# Patient Record
Sex: Female | Born: 1970 | Race: White | Hispanic: No | Smoking: Current every day smoker
Health system: Southern US, Community
[De-identification: ages and names within clinical notes are randomized; demographics above are authoritative.]

## PROBLEM LIST (undated history)

## (undated) DIAGNOSIS — I10 Essential (primary) hypertension: Secondary | ICD-10-CM

## (undated) DIAGNOSIS — F419 Anxiety disorder, unspecified: Secondary | ICD-10-CM

## (undated) DIAGNOSIS — D72829 Elevated white blood cell count, unspecified: Principal | ICD-10-CM

## (undated) HISTORY — DX: Elevated white blood cell count, unspecified: D72.829

## (undated) HISTORY — PX: CHOLECYSTECTOMY: SHX55

## (undated) HISTORY — PX: ABDOMINAL SURGERY: SHX537

## (undated) HISTORY — PX: ABDOMINAL HYSTERECTOMY: SHX81

---

## 2016-03-07 ENCOUNTER — Emergency Department (HOSPITAL_COMMUNITY): Payer: Self-pay

## 2016-03-07 ENCOUNTER — Encounter (HOSPITAL_COMMUNITY): Payer: Self-pay | Admitting: Emergency Medicine

## 2016-03-07 ENCOUNTER — Emergency Department (HOSPITAL_COMMUNITY)
Admission: EM | Admit: 2016-03-07 | Discharge: 2016-03-07 | Disposition: A | Discharge status: Home / Self Care | Payer: Self-pay | Attending: Emergency Medicine | Admitting: Emergency Medicine

## 2016-03-07 DIAGNOSIS — R0789 Other chest pain: Secondary | ICD-10-CM | POA: Insufficient documentation

## 2016-03-07 DIAGNOSIS — I10 Essential (primary) hypertension: Secondary | ICD-10-CM | POA: Insufficient documentation

## 2016-03-07 DIAGNOSIS — Z79899 Other long term (current) drug therapy: Secondary | ICD-10-CM | POA: Insufficient documentation

## 2016-03-07 DIAGNOSIS — F17210 Nicotine dependence, cigarettes, uncomplicated: Secondary | ICD-10-CM | POA: Insufficient documentation

## 2016-03-07 DIAGNOSIS — M546 Pain in thoracic spine: Secondary | ICD-10-CM | POA: Insufficient documentation

## 2016-03-07 HISTORY — DX: Anxiety disorder, unspecified: F41.9

## 2016-03-07 HISTORY — DX: Essential (primary) hypertension: I10

## 2016-03-07 LAB — CBC WITH DIFFERENTIAL/PLATELET
BASOS PCT: 0 %
Basophils Absolute: 0 10*3/uL (ref 0.0–0.1)
EOS ABS: 0.1 10*3/uL (ref 0.0–0.7)
EOS PCT: 1 %
HCT: 45.8 % (ref 36.0–46.0)
Hemoglobin: 14.9 g/dL (ref 12.0–15.0)
LYMPHS ABS: 6.5 10*3/uL — AB (ref 0.7–4.0)
Lymphocytes Relative: 38 %
MCH: 29.3 pg (ref 26.0–34.0)
MCHC: 32.5 g/dL (ref 30.0–36.0)
MCV: 90.2 fL (ref 78.0–100.0)
MONO ABS: 0.7 10*3/uL (ref 0.1–1.0)
MONOS PCT: 4 %
Neutro Abs: 9.6 10*3/uL — ABNORMAL HIGH (ref 1.7–7.7)
Neutrophils Relative %: 57 %
PLATELETS: 274 10*3/uL (ref 150–400)
RBC: 5.08 MIL/uL (ref 3.87–5.11)
RDW: 13.6 % (ref 11.5–15.5)
WBC: 16.9 10*3/uL — ABNORMAL HIGH (ref 4.0–10.5)

## 2016-03-07 LAB — COMPREHENSIVE METABOLIC PANEL
ALBUMIN: 4.3 g/dL (ref 3.5–5.0)
ALK PHOS: 92 U/L (ref 38–126)
ALT: 17 U/L (ref 14–54)
ANION GAP: 10 (ref 5–15)
AST: 19 U/L (ref 15–41)
BILIRUBIN TOTAL: 0.6 mg/dL (ref 0.3–1.2)
BUN: 13 mg/dL (ref 6–20)
CALCIUM: 9.3 mg/dL (ref 8.9–10.3)
CO2: 29 mmol/L (ref 22–32)
CREATININE: 0.78 mg/dL (ref 0.44–1.00)
Chloride: 101 mmol/L (ref 101–111)
GFR calc Af Amer: >60 mL/min (ref >60)
GFR calc non Af Amer: >60 mL/min (ref >60)
GLUCOSE: 105 mg/dL — AB (ref 65–99)
Potassium: 4.4 mmol/L (ref 3.5–5.1)
Sodium: 140 mmol/L (ref 135–145)
TOTAL PROTEIN: 7.3 g/dL (ref 6.5–8.1)

## 2016-03-07 LAB — D-DIMER, QUANTITATIVE (NOT AT ARMC): D DIMER QUANT: 0.46 ug{FEU}/mL (ref 0.00–0.50)

## 2016-03-07 LAB — TROPONIN I: Troponin I: <0.03 ng/mL (ref <0.031)

## 2016-03-07 MED ORDER — OXYCODONE-ACETAMINOPHEN 5-325 MG PO TABS
1.0000 | ORAL_TABLET | Freq: Once | ORAL | Status: AC
  Administered 2016-03-07: 1 via ORAL
  Filled 2016-03-07: qty 1

## 2016-03-07 MED ORDER — METHOCARBAMOL 500 MG PO TABS: 1000.0000 mg | ORAL_TABLET | Freq: Four times a day (QID) | ORAL | Status: AC | PRN

## 2016-03-07 MED ORDER — HYDROCODONE-ACETAMINOPHEN 5-325 MG PO TABS: ORAL_TABLET | ORAL | Status: AC

## 2016-03-07 NOTE — ED Provider Notes (Signed)
CSN: [id number]     Arrival date & time 03/07/16  0932 History   First MD Initiated Contact with Patient 03/07/16 1022     Chief Complaint  Patient presents with  . Chest Pain      HPI  Pt was seen at 1050. Per pt, c/o gradual onset and persistence of constant left sided chest wall "pain" that began yesterday. Has been associated with left sided mid-back "pain." Describes the pain as "tightness" and "aching," worse with palpation of the areas. Has been associated with SOB. States she saw her PMD 2 days ago "for the first time in a year," and was started on lisinopril, zoloft and ativan. Denies palpitations, no cough, no abd pain, no N/V/D, no injury, no focal motor weakness, no fevers, no rash.    Past Medical History  Diagnosis Date  . Hypertension   . Anxiety    Past Surgical History  Procedure Laterality Date  . Cholecystectomy    . Abdominal hysterectomy    . Abdominal surgery      exploratory  . Cesarean section      Social History  Substance Use Topics  . Smoking status: Current Every Day Smoker -- 0.50 packs/day    Types: Cigarettes  . Smokeless tobacco: None  . Alcohol Use: No     Comment: 07/2014 quit date   OB History    Gravida Para Term Preterm AB TAB SAB Ectopic Multiple Living   1         1     Review of Systems ROS: Statement: All systems negative except as marked or noted in the HPI; Constitutional: Negative for fever and chills. ; ; Eyes: Negative for eye pain, redness and discharge. ; ; ENMT: Negative for ear pain, hoarseness, nasal congestion, sinus pressure and sore throat. ; ; Cardiovascular: Negative for palpitations, diaphoresis, dyspnea and peripheral edema. ; ; Respiratory: Negative for cough, wheezing and stridor. ; ; Gastrointestinal: Negative for nausea, vomiting, diarrhea, abdominal pain, blood in stool, hematemesis, jaundice and rectal bleeding. . ; ; Genitourinary: Negative for dysuria, flank pain and hematuria. ; ; Musculoskeletal: +chest wall  pain, back pain. Negative for neck pain. Negative for swelling and trauma.; ; Skin: Negative for pruritus, rash, abrasions, blisters, bruising and skin lesion.; ; Neuro: Negative for headache, lightheadedness and neck stiffness. Negative for weakness, altered level of consciousness , altered mental status, extremity weakness, paresthesias, involuntary movement, seizure and syncope.      Allergies  Review of patient's allergies indicates no known allergies.  Home Medications   Prior to Admission medications   Medication Sig Start Date End Date Taking? Authorizing Provider  acidophilus (RISAQUAD) CAPS capsule Take 1 capsule by mouth daily.   Yes Historical Provider, MD  lisinopril-hydrochlorothiazide (PRINZIDE,ZESTORETIC) 20-12.5 MG tablet Take 1 tablet by mouth daily.   Yes Historical Provider, MD  LORazepam (ATIVAN) 0.5 MG tablet Take 0.5 mg by mouth 2 (two) times daily.   Yes Historical Provider, MD  Multiple Vitamins-Minerals (ALIVE ONCE DAILY WOMENS PO) Take 1 tablet by mouth daily.   Yes Historical Provider, MD  sertraline (ZOLOFT) 50 MG tablet Take 50 mg by mouth daily.   Yes Historical Provider, MD   BP 129/75 mmHg  Pulse 80  Temp(Src) 98.3 F (36.8 C) (Oral)  Resp 12  Ht 5' 3.5" (1.613 m)  Wt 204 lb (92.534 kg)  BMI 35.57 kg/m2  SpO2 94% Physical Exam  1055: Physical examination:  Nursing notes reviewed; Vital signs and O2 SAT reviewed;  Constitutional: Well developed, Well nourished, Well hydrated, Anxious; Head:  Normocephalic, atraumatic; Eyes: EOMI, PERRL, No scleral icterus; ENMT: TM's clear bilat. +edemetous nasal turbinates bilat with clear rhinorrhea. Mouth and pharynx without lesions. No tonsillar exudates. No intra-oral edema. No submandibular or sublingual edema. No hoarse voice, no drooling, no stridor. No pain with manipulation of larynx. No trismus. Mouth and pharynx normal, Mucous membranes moist; Neck: Supple, Full range of motion, No lymphadenopathy;  Cardiovascular: Regular rate and rhythm, No murmur, rub, or gallop; Respiratory: Breath sounds clear & equal bilaterally, No rales, rhonchi, wheezes.  Speaking full sentences with ease, Normal respiratory effort/excursion; Chest: +left parasternal and anterior chest wall areas tender to palp. No deformity, no soft tissue crepitus, no rash. Movement normal; Abdomen: Soft, Nontender, Nondistended, Normal bowel sounds; Genitourinary: No CVA tenderness; Spine:  No midline CS, TS, LS tenderness. +TTP left thoracic paraspinal muscles. No rash.;; Extremities: Pulses normal, No tenderness, No edema, No calf edema or asymmetry.; Neuro: AA&Ox3, Major CN grossly intact.  Speech clear. No gross focal motor or sensory deficits in extremities.; Skin: Color normal, Warm, Dry.; Psych:  Anxious, crying.     ED Course  Procedures (including critical care time) Labs Review   Imaging Review  I have personally reviewed and evaluated these images and lab results as part of my medical decision-making.   EKG Interpretation   Date/Time:  Friday March 07 2016 09:43:30 EDT Ventricular Rate:  81 PR Interval:  162 QRS Duration: 95 QT Interval:  384 QTC Calculation: 446 R Axis:   -75 Text Interpretation:  Sinus rhythm Left anterior fascicular block Low  voltage, precordial leads No old tracing to compare Confirmed by [facility]   MD, Nicholos Johns [phone]) on 03/07/2016 11:08:07 AM      MDM  MDM Reviewed: previous chart, nursing note and vitals Reviewed previous: labs and ECG Interpretation: labs, ECG and x-ray     Results for orders placed or performed during the hospital encounter of 03/07/16  Comprehensive metabolic panel  Result Value Ref Range   Sodium 140 135 - 145 mmol/L   Potassium 4.4 3.5 - 5.1 mmol/L   Chloride 101 101 - 111 mmol/L   CO2 29 22 - 32 mmol/L   Glucose, Bld 105 (H) 65 - 99 mg/dL   BUN 13 6 - 20 mg/dL   Creatinine, Ser 6.04 0.44 - 1.00 mg/dL   Calcium 9.3 8.9 - 54.0 mg/dL   Total Protein  7.3 6.5 - 8.1 g/dL   Albumin 4.3 3.5 - 5.0 g/dL   AST 19 15 - 41 U/L   ALT 17 14 - 54 U/L   Alkaline Phosphatase 92 38 - 126 U/L   Total Bilirubin 0.6 0.3 - 1.2 mg/dL   GFR calc non Af Amer >60 >60 mL/min   GFR calc Af Amer >60 >60 mL/min   Anion gap 10 5 - 15  Troponin I  Result Value Ref Range   Troponin I <0.03 <0.031 ng/mL  CBC with Differential  Result Value Ref Range   WBC 16.9 (H) 4.0 - 10.5 K/uL   RBC 5.08 3.87 - 5.11 MIL/uL   Hemoglobin 14.9 12.0 - 15.0 g/dL   HCT 98.1 19.1 - 47.8 %   MCV 90.2 78.0 - 100.0 fL   MCH 29.3 26.0 - 34.0 pg   MCHC 32.5 30.0 - 36.0 g/dL   RDW 29.5 62.1 - 30.8 %   Platelets 274 150 - 400 K/uL   Neutrophils Relative % 57 %   Neutro Abs  9.6 (H) 1.7 - 7.7 K/uL   Lymphocytes Relative 38 %   Lymphs Abs 6.5 (H) 0.7 - 4.0 K/uL   Monocytes Relative 4 %   Monocytes Absolute 0.7 0.1 - 1.0 K/uL   Eosinophils Relative 1 %   Eosinophils Absolute 0.1 0.0 - 0.7 K/uL   Basophils Relative 0 %   Basophils Absolute 0.0 0.0 - 0.1 K/uL   WBC Morphology ABSOLUTE LYMPHOCYTOSIS   D-dimer, quantitative  Result Value Ref Range   D-Dimer, Quant 0.46 0.00 - 0.50 ug/mL-FEU   Dg Chest 2 View 03/07/2016  CLINICAL DATA:  Chest pain. EXAM: CHEST  2 VIEW COMPARISON:  None. FINDINGS: The heart size and mediastinal contours are within normal limits. Both lungs are clear. No pneumothorax or pleural effusion is noted. Mild dextroscoliosis of thoracic spine is noted. IMPRESSION: No active cardiopulmonary disease. Electronically Signed   By: Lupita Raider, M.D.   On: 03/07/2016 12:32    1310:  WBC non-specifically elevated. Pt denies sore throat, dysuria. Remains afebrile. CXR without infiltrate. Doubt PE as cause for symptoms with normal d-dimer and low risk [place].  Doubt ACS as cause for symptoms with normal troponin and unchanged EKG from previous after 2 days of constant symptoms.  No clear indication for admission at this time.  Pt very tearful, crying, stating she is  "tired" and her "whole body hurts wherever you touch it," "no one cares or is helping" her.  Endorses admission to [facility] last year for "low WBC count" and "a raging infection from here to here" (points to left side top of head to left chest/armpit areas). Care Everywhere accessed: pt presented to [facility] ED for sinus pressure, dx localized sinus infection, was noted to have new neutropenia and transferred to [facility], pt had extensive workup by Oncology dept which was negative and she recovered without intervention other than abx for "sinus infection."  I went back and had extensive d/w pt regarding her medical records from [facility], which ultimately were reassuring. Pt continues tearful, stating she "doesn't know what to do next." Extensive discussion followed. Pt will f/u with Heme/Onc (whether [facility] or [facility]) and PMD. Pt has finally calmed, states she is ready to go home now. Dx and testing d/w pt and family.  Questions answered.  Verb understanding, agreeable to d/c home with outpt f/u.     Samuel Jester, DO 03/10/16 [phone]

## 2016-03-07 NOTE — Discharge Instructions (Signed)
Take the prescriptions as directed.  Apply moist heat or ice to the area(s) of discomfort, for 15 minutes at a time, several times per day for the next few days.  Do not fall asleep on a heating or ice pack.  Call your regular medical doctor and your Hematologist/Oncologist today to schedule a follow up appointment within the next week.  Return to the Emergency Department immediately if worsening.

## 2016-03-07 NOTE — ED Notes (Signed)
Assumed care of patient from Meghan, RN.  

## 2016-03-07 NOTE — ED Notes (Signed)
PT states has recently started on ativan, zoloft and lisinopril. PT states while walking dog yesterday she became SOB and had chest tightness with back pain as well. PT denies any pain at this time.

## 2016-04-01 ENCOUNTER — Encounter (HOSPITAL_COMMUNITY): Payer: Self-pay | Admitting: Oncology

## 2016-04-01 DIAGNOSIS — D72829 Elevated white blood cell count, unspecified: Secondary | ICD-10-CM

## 2016-04-01 HISTORY — DX: Elevated white blood cell count, unspecified: D72.829

## 2016-04-01 NOTE — Progress Notes (Signed)
NO SHOW  ROS  

## 2016-04-01 NOTE — Assessment & Plan Note (Signed)
Leukocytosis with neutrophilia and lymphocytosis predominance.  I reviewed the potential causes of leukocytosis (specifically mild neutrophilia) including but not limited to: ?Any active inflammatory condition or infection ?Cigarette smoking, which may be the most common cause of mild neutrophilia ?Previously diagnosed hematologic disease (such as acute and chronic leukemias, chronic myeloproliferative or myelodysplastic disease) ?The presence of, and treatment for, a chronic anxiety state, panic disorder, rage, or emotional stress (eg, posttraumatic stress disorder, depression) ?Presence of non-hematologic diseases known to increase neutrophil counts (eg, eclampsia, thyroid storm, hypercortisolism). ?Prior splenectomy or known asplenia ?Positive family history of neutrophilia ?Recent vaccination  Medications - Various medications may cause neutrophilia. However,  such cases are rare and appear in the literature as isolated case reports.  Plan today is to proceed with a CBC diff, CMET, peripheral smear review, evaluation for MPD, BCR-ABL to r/o CML, CRP and ESR to look for occult inflammatory disease  We will see her back once her lab results have come back in, in two weeks. We will go over everything at that time.

## 2016-04-02 ENCOUNTER — Ambulatory Visit (HOSPITAL_COMMUNITY): Payer: Self-pay | Admitting: Oncology

## 2017-04-27 IMAGING — DX DG CHEST 2V
2 series · 2 of 2 positions shown · non-contrast
Comparison: None.

CLINICAL DATA: Chest pain.

EXAM:
CHEST  2 VIEW

[chest pa]
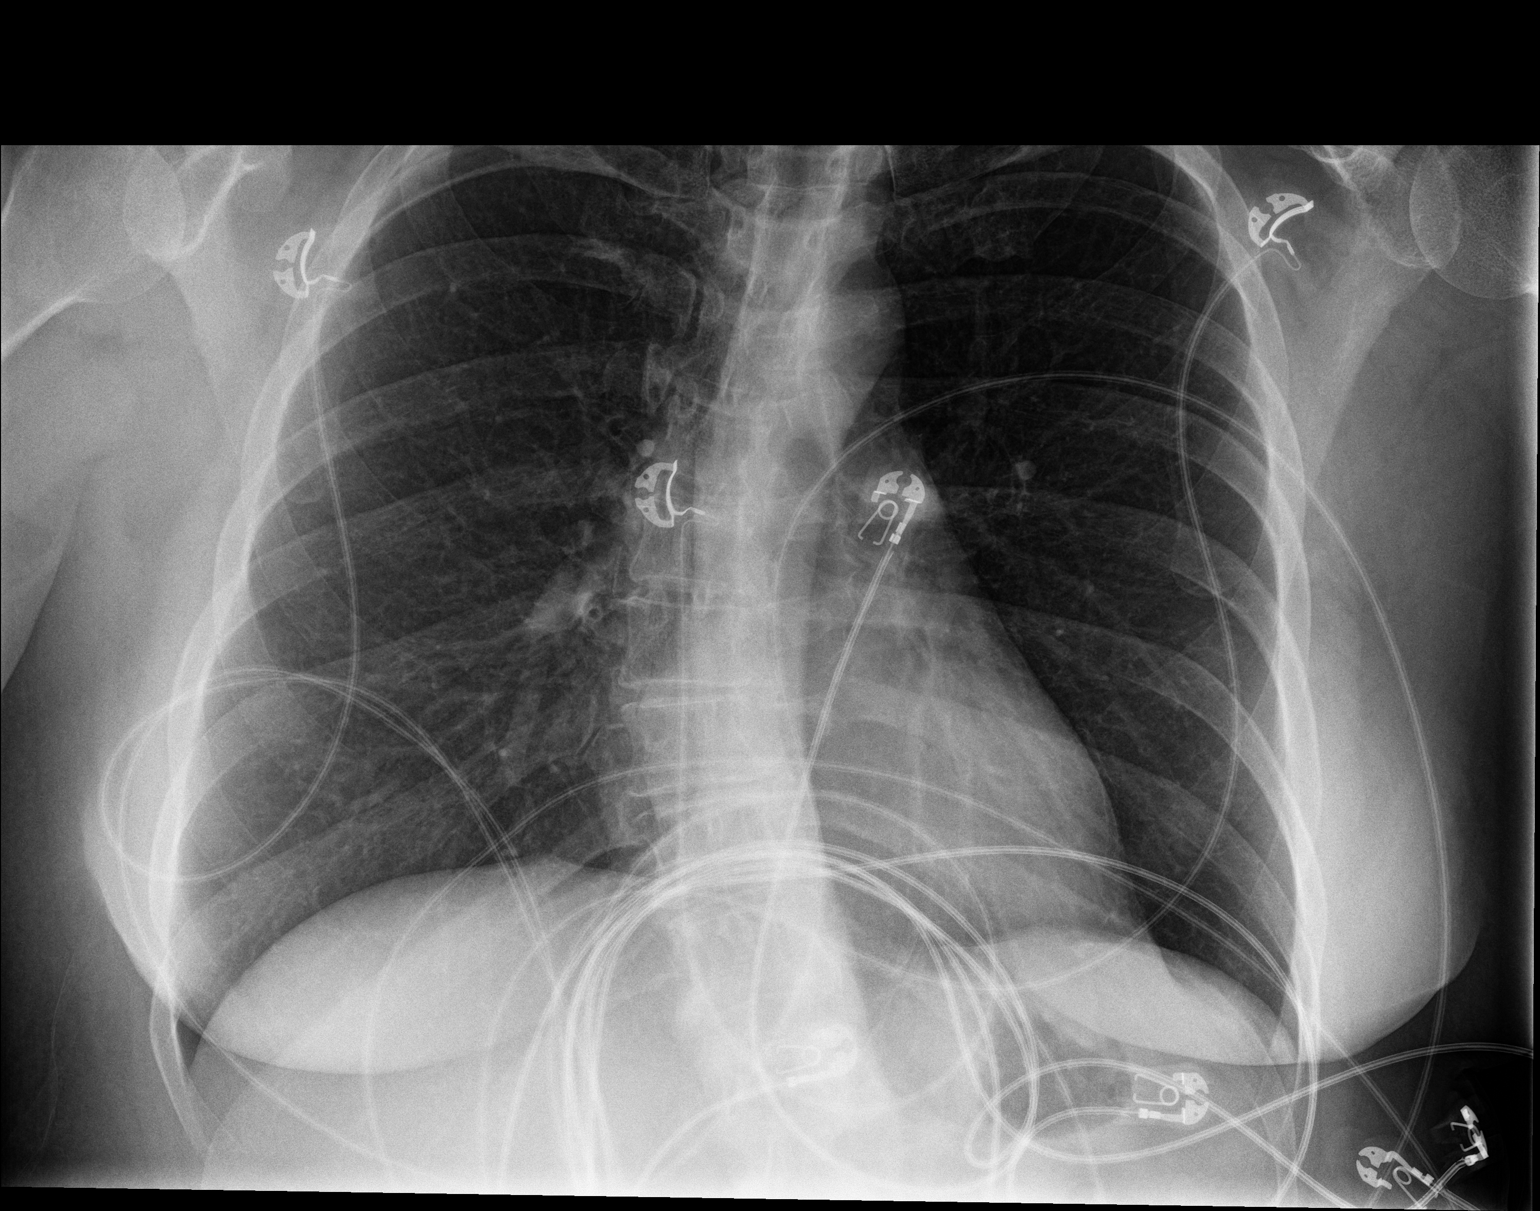

[chest lat]
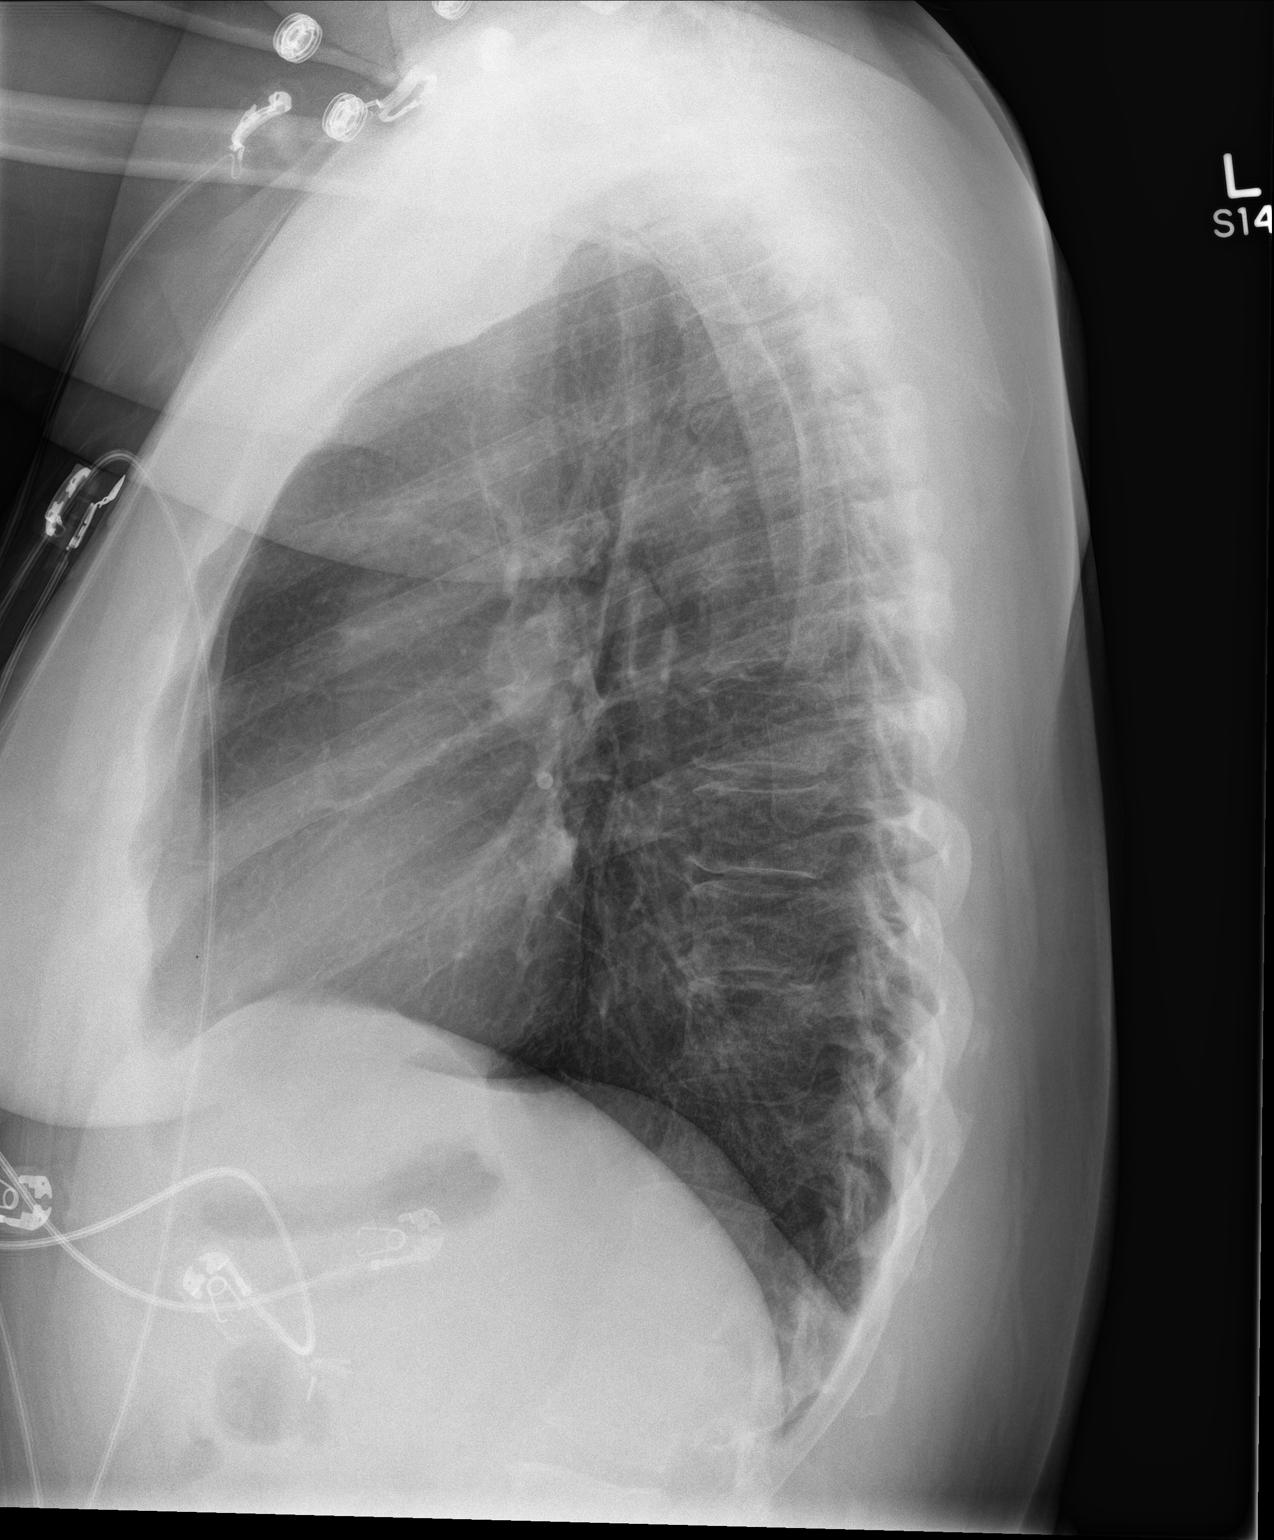

[2 of 2 positions shown; findings below may reference images not displayed]

FINDINGS: The heart size and mediastinal contours are within normal limits.
Both lungs are clear. No pneumothorax or pleural effusion is noted.
Mild dextroscoliosis of thoracic spine is noted.
IMPRESSION: No active cardiopulmonary disease.
